# Patient Record
Sex: Female | Born: 2000 | Race: White | Hispanic: No | Marital: Single | Smoking: Never smoker
Health system: Southern US, Community
[De-identification: ages and names within clinical notes are randomized; demographics above are authoritative.]

## PROBLEM LIST (undated history)

## (undated) DIAGNOSIS — G90A Postural orthostatic tachycardia syndrome (POTS): Secondary | ICD-10-CM

## (undated) DIAGNOSIS — I951 Orthostatic hypotension: Secondary | ICD-10-CM

## (undated) DIAGNOSIS — I498 Other specified cardiac arrhythmias: Secondary | ICD-10-CM

## (undated) DIAGNOSIS — R000 Tachycardia, unspecified: Secondary | ICD-10-CM

## (undated) DIAGNOSIS — R51 Headache: Secondary | ICD-10-CM

## (undated) HISTORY — PX: BREAST REDUCTION SURGERY: SHX8

## (undated) HISTORY — DX: Headache: R51

---
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
## 2020-11-12 IMAGING — MR MR MRV HEAD WO/W CM
4 series · 48 of 48 positions shown · IV contrast (13ml multihance)
Comparison: MRI/MRA head 11/23/2014, head CT 03/09/2014

CONTRAST:  13mL MULTIHANCE GADOBENATE DIMEGLUMINE 529 MG/ML IV SOLN

CLINICAL DATA: Chronic migraine without [place] without status
migrainosus, not intractable. Additional history provided by
scanning technologist: Chronic migraine headaches since November 2018.

EXAM:
MRI HEAD WITHOUT AND WITH CONTRAST
MRV HEAD WITHOUT AND WITH CONTRAST
TECHNIQUE: Multiplanar, multiecho pulse sequences of the brain and surrounding
structures were obtained without and with intravenous contrast.
Angiographic images of the intracranial venous structures were
obtained using MRV technique without and with intravenous contrast.

[Series 1: TOF · coronal · 2.5mm · 0.98mm/px · 15 of 112 slices shown]
[im 1/112]
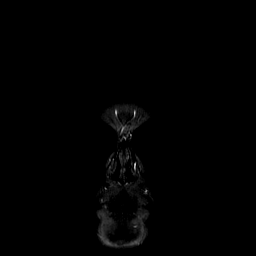
[im 8/112]
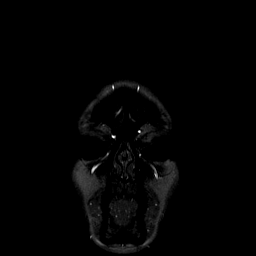
[im 16/112]
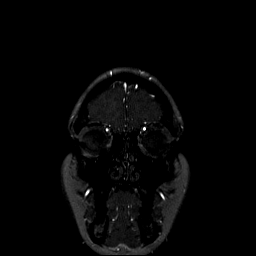
[im 24/112]
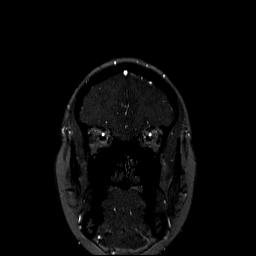
[im 32/112]
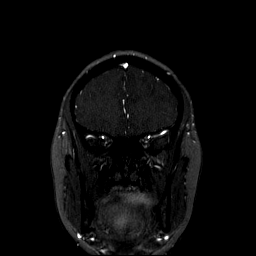
[im 40/112]
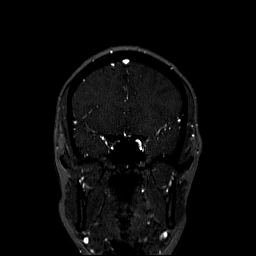
[im 48/112]
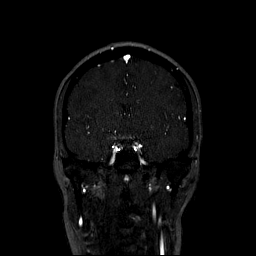
[im 56/112]
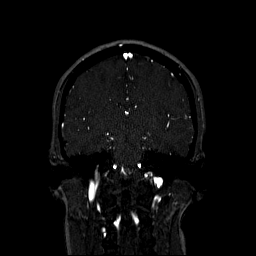
[im 64/112]
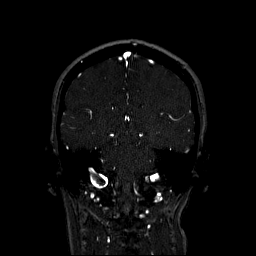
[im 72/112]
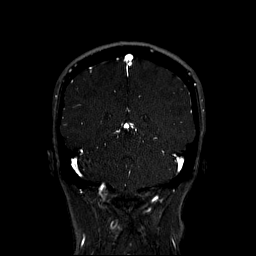
[im 80/112]
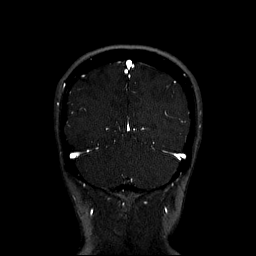
[im 88/112]
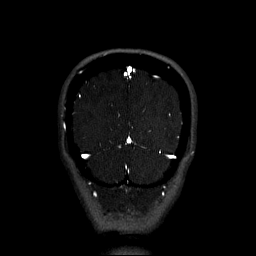
[im 96/112]
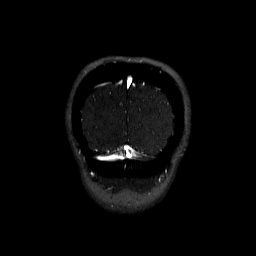
[im 104/112]
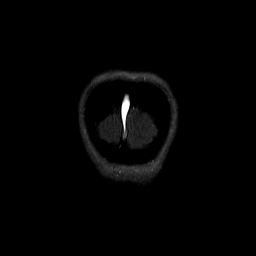
[im 112/112]
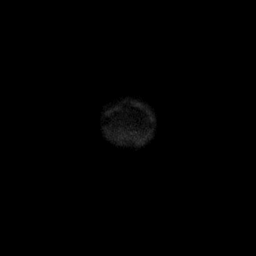

[Series 9: T1 · sagittal · 1.0mm · 0.94mm/px · 18 of 144 slices shown]
[im 1/144]
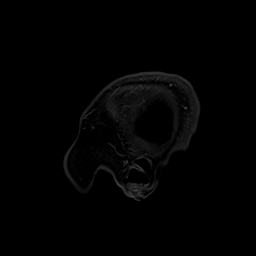
[im 9/144]
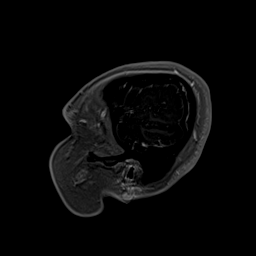
[im 17/144]
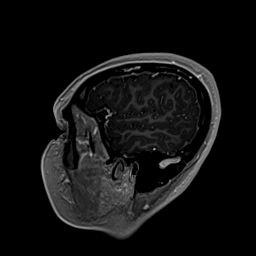
[im 26/144]
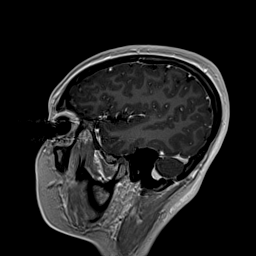
[im 34/144]
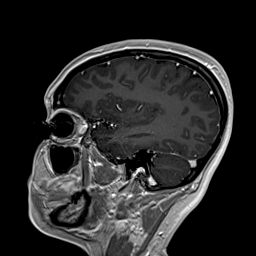
[im 43/144]
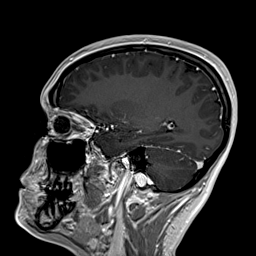
[im 51/144]
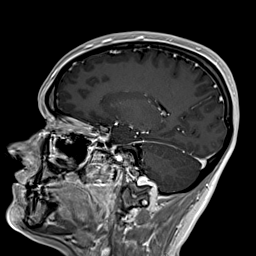
[im 59/144]
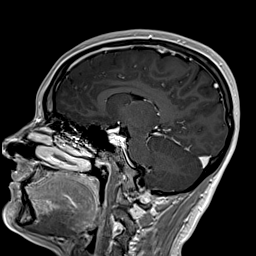
[im 68/144]
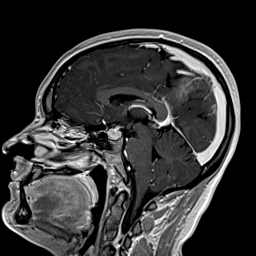
[im 76/144]
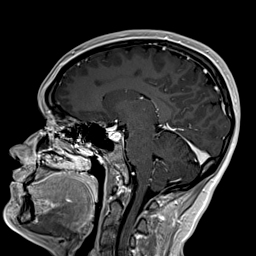
[im 85/144]
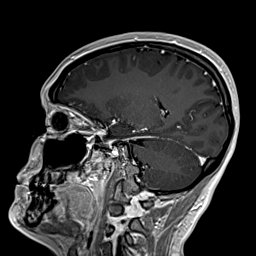
[im 93/144]
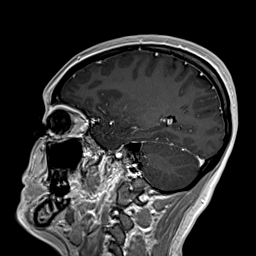
[im 101/144]
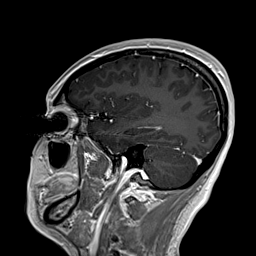
[im 110/144]
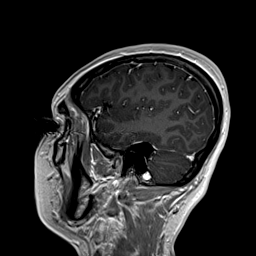
[im 118/144]
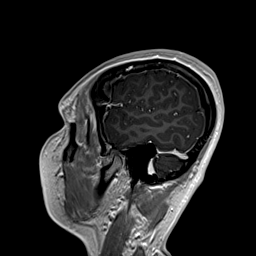
[im 127/144]
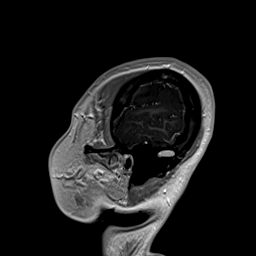
[im 135/144]
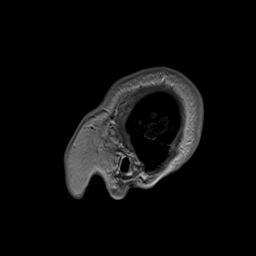
[im 144/144]
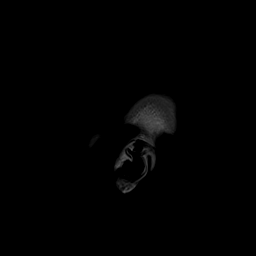

[Series 104: MRV · axial · 1.0mm · 0.94mm/px · z∈[-110,+44]mm · 7 of 53 slices shown (1 of 2)]
[im 1/53]
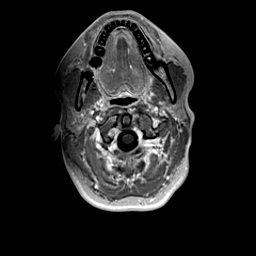
[im 9/53]
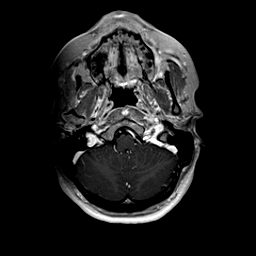
[im 18/53]
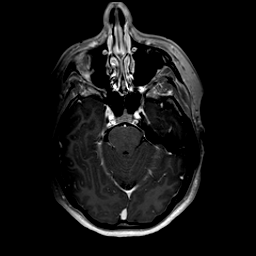
[im 27/53]
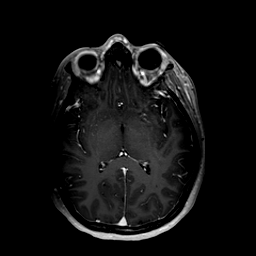
[im 35/53]
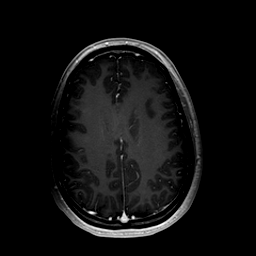
[im 44/53]
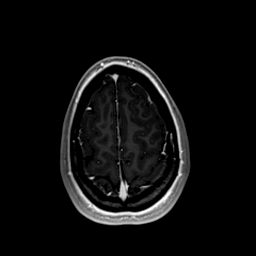
[im 53/53]
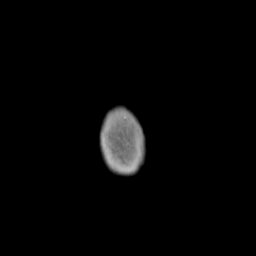

[Series 106: MRV · coronal · 1.0mm · 0.94mm/px · 8 of 66 slices shown (2 of 2)]
[im 1/66]
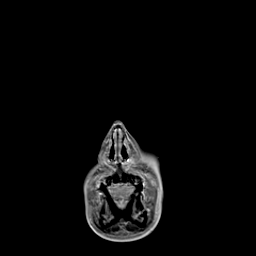
[im 10/66]
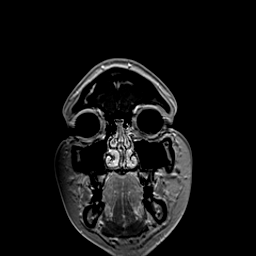
[im 19/66]
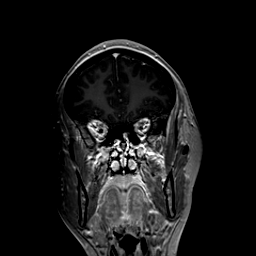
[im 28/66]
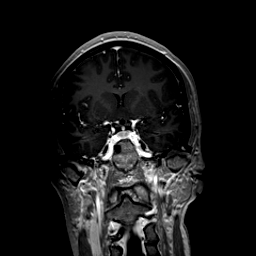
[im 38/66]
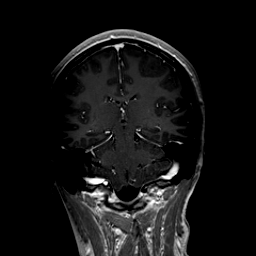
[im 47/66]
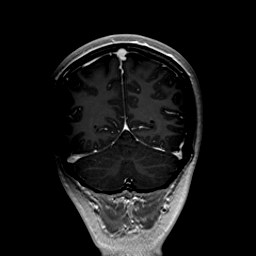
[im 56/66]
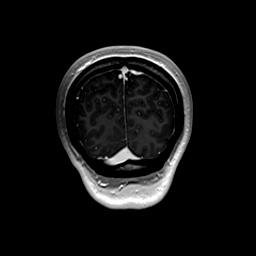
[im 66/66]
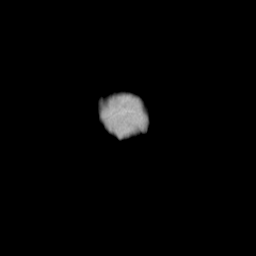

[48 of 48 positions shown; findings below may reference images not displayed]

FINDINGS: MR HEAD FINDINGS

Brain:

There is no evidence of acute infarct.

No evidence of intracranial mass.

No midline shift or extra-axial fluid collection.

No chronic intracranial blood products.

No focal parenchymal signal abnormality or abnormal intracranial
enhancement is demonstrated.

Cerebral volume is normal for age.

The pituitary gland is prominent size, although within normal limits
for patient age.

Vascular: Flow voids maintained within the proximal large arterial
vessels.

Skull and upper cervical spine: Normal marrow signal.

Sinuses/Orbits: Visualized orbits demonstrate no acute abnormality.
Mild ethmoid sinus mucosal thickening. Trace fluid within right
mastoid air cells.

MRV HEAD FINDINGS

The superior sagittal sinus, internal cerebral veins, vein of [place],
straight sinus, transverse sinuses, sigmoid sinuses and visualized
jugular veins are patent. No convincing evidence of thrombus.
IMPRESSION: MRI brain:

1. Normal MRI appearance of the brain. No evidence of acute
intracranial abnormality.
2. Mild ethmoid sinus mucosal thickening.

MRV head:

Unremarkable examination.  No evidence of venous thrombosis.

## 2021-12-13 ENCOUNTER — Encounter (HOSPITAL_BASED_OUTPATIENT_CLINIC_OR_DEPARTMENT_OTHER): Payer: Self-pay | Admitting: Emergency Medicine

## 2021-12-13 ENCOUNTER — Other Ambulatory Visit: Payer: Self-pay

## 2021-12-13 ENCOUNTER — Emergency Department (HOSPITAL_BASED_OUTPATIENT_CLINIC_OR_DEPARTMENT_OTHER)
Admission: EM | Admit: 2021-12-13 | Discharge: 2021-12-13 | Disposition: A | Discharge status: Home / Self Care | Payer: Managed Care, Other (non HMO) | Attending: Emergency Medicine | Admitting: Emergency Medicine

## 2021-12-13 DIAGNOSIS — G43909 Migraine, unspecified, not intractable, without status migrainosus: Secondary | ICD-10-CM | POA: Diagnosis not present

## 2021-12-13 MED ORDER — METOCLOPRAMIDE HCL 5 MG/ML IJ SOLN
10.0000 mg | Freq: Once | INTRAMUSCULAR | Status: AC
  Administered 2021-12-13: 10 mg via INTRAVENOUS
  Filled 2021-12-13: qty 2

## 2021-12-13 MED ORDER — DIPHENHYDRAMINE HCL 50 MG/ML IJ SOLN
25.0000 mg | Freq: Once | INTRAMUSCULAR | Status: AC
  Administered 2021-12-13: 25 mg via INTRAVENOUS
  Filled 2021-12-13: qty 1

## 2021-12-13 MED ORDER — ACETAMINOPHEN 325 MG PO TABS
650.0000 mg | ORAL_TABLET | Freq: Once | ORAL | Status: AC
  Administered 2021-12-13: 650 mg via ORAL
  Filled 2021-12-13: qty 2

## 2021-12-13 MED ORDER — MAGNESIUM SULFATE IN D5W 1-5 GM/100ML-% IV SOLN
1.0000 g | Freq: Once | INTRAVENOUS | Status: AC
  Administered 2021-12-13: 1 g via INTRAVENOUS
  Filled 2021-12-13: qty 100

## 2021-12-13 MED ORDER — ONDANSETRON 4 MG PO TBDP
4.0000 mg | ORAL_TABLET | Freq: Once | ORAL | Status: AC | PRN
  Administered 2021-12-13: 4 mg via ORAL
  Filled 2021-12-13: qty 1

## 2021-12-13 MED ORDER — SODIUM CHLORIDE 0.9 % IV BOLUS
1000.0000 mL | Freq: Once | INTRAVENOUS | Status: AC
  Administered 2021-12-13: 1000 mL via INTRAVENOUS

## 2021-12-13 NOTE — ED Provider Notes (Addendum)
?[facility] EMERGENCY DEPT ?Provider Note ? ? ?CSN: [id number] ?Arrival date & time: 12/13/21  1904 ? ?  ? ?History ? ?Chief Complaint  ?Patient presents with  ? Migraine  ? ? ?Kennley Bracco is a 21 y.o. female. ? ?HPI ?Patient is a 21 year old female with a history of migraines who presents to the emergency department due to a migraine headache.  She states her symptoms started last night and have been constant.  States it is generalized but worse along the frontal aspect of her head.  Reports associated nausea/vomiting, photophobia, phonophobia.  States that she typically gets spots in her vision when experiencing migraines but otherwise denies any visual changes.  Denies any dizziness, difficulty ambulating, fevers, chills, chest pain, shortness of breath, URI symptoms, numbness, weakness.  She states that she has been experiencing chronic migraines since she was a small child and states that her current symptoms feel similar to prior migraines.  She states that she has been taking her prescribed Ubrelvy along with Excedrin Migraine and ibuprofen without relief. ?  ? ?Home Medications ?Prior to Admission medications   ?Medication Sig Start Date End Date Taking? Authorizing Provider  ?acetaminophen (TYLENOL) 500 MG tablet Take 500 mg by mouth every 6 (six) hours as needed for headache.    [provider]  ?aspirin-acetaminophen-caffeine (EXCEDRIN MIGRAINE) [phone] MG per tablet Take 1-2 tablets by mouth every 6 (six) hours as needed for migraine. Take 2 tablets at onset of migraine    [provider]  ?atenolol (TENORMIN) 25 MG tablet Take 1 tablet (25 mg total) by mouth daily. 01/25/15   Keturah Shavers, MD  ?cyclobenzaprine (FLEXERIL) 5 MG tablet Take 1 tablet (5 mg total) by mouth every 8 (eight) hours as needed for muscle spasms. 01/09/17   Sherrilee Gilles, NP  ?eletriptan (RELPAX) 20 MG tablet Take 1 tablet (20 mg total) by mouth once as needed for migraine or headache.  Maximum 2 times a week 01/25/15   Keturah Shavers, MD  ?ibuprofen (ADVIL,MOTRIN) 600 MG tablet Take 1 tablet (600 mg total) by mouth every 6 (six) hours as needed for mild pain or moderate pain. ?Patient taking differently: Take 800 mg by mouth every 6 (six) hours as needed for headache, mild pain or moderate pain.  11/22/14   Lowanda Foster, NP  ?ibuprofen (ADVIL,MOTRIN) 600 MG tablet Take 1 tablet (600 mg total) by mouth every 6 (six) hours as needed for headache, mild pain or moderate pain. 01/09/17   Sherrilee Gilles, NP  ?Magnesium Oxide 500 MG TABS Take 500 mg by mouth at bedtime.     [provider]  ?ondansetron (ZOFRAN ODT) 4 MG disintegrating tablet Take 1 tablet (4 mg total) by mouth every 8 (eight) hours as needed for nausea or vomiting. 01/09/17   Sherrilee Gilles, NP  ?ondansetron (ZOFRAN-ODT) 4 MG disintegrating tablet Take 1 tablet (4 mg total) by mouth every 6 (six) hours as needed for nausea or vomiting. 11/22/14   Lowanda Foster, NP  ?promethazine (PHENERGAN) 12.5 MG tablet Give 1 tablet at onset of nausea. May repeat every 8 hours if nausea persists. 05/08/13   Elveria Rising, NP  ?Riboflavin 100 MG TABS Take 100 mg by mouth daily.     [provider]  ?topiramate (TOPAMAX) 25 MG tablet Take 1 tablet (25 mg total) by mouth at bedtime. 01/25/15   Keturah Shavers, MD  ?   ? ?Allergies    ?Penicillins, Prednisone, Inderal [propranolol], and Other   ? ?Review  of Systems   ?Review of Systems  ?All other systems reviewed and are negative. ?Ten systems reviewed and are negative for acute change, except as noted in the HPI.   ?Physical Exam ?Updated Vital Signs ?BP 113/67   Pulse 72   Temp 98.4 ?F (36.9 ?C) (Oral)   Resp 18   Wt 81.6 kg   LMP 11/16/2021 (Approximate)   SpO2 100%  ?Physical Exam ?Vitals and nursing note reviewed.  ?Constitutional:   ?   General: She is not in acute distress. ?   Appearance: Normal appearance. She is not ill-appearing, toxic-appearing or diaphoretic.   ?HENT:  ?   Head: Normocephalic and atraumatic.  ?   Right Ear: External ear normal.  ?   Left Ear: External ear normal.  ?   Nose: Nose normal.  ?   Mouth/Throat:  ?   Mouth: Mucous membranes are moist.  ?   Pharynx: Oropharynx is clear. No oropharyngeal exudate or posterior oropharyngeal erythema.  ?Eyes:  ?   General: No scleral icterus.    ?   Right eye: No discharge.     ?   Left eye: No discharge.  ?   Extraocular Movements: Extraocular movements intact.  ?   Conjunctiva/sclera: Conjunctivae normal.  ?   Pupils: Pupils are equal, round, and reactive to light.  ?   Comments: PERRL. EOMI.  ?Cardiovascular:  ?   Rate and Rhythm: Normal rate and regular rhythm.  ?   Pulses: Normal pulses.  ?   Heart sounds: Normal heart sounds. No murmur heard. ?  No friction rub. No gallop.  ?Pulmonary:  ?   Effort: Pulmonary effort is normal. No respiratory distress.  ?   Breath sounds: Normal breath sounds. No stridor. No wheezing, rhonchi or rales.  ?Abdominal:  ?   General: Abdomen is flat. There is no distension.  ?Musculoskeletal:     ?   General: Normal range of motion.  ?   Cervical back: Normal range of motion and neck supple. No tenderness.  ?Skin: ?   General: Skin is warm and dry.  ?Neurological:  ?   General: No focal deficit present.  ?   Mental Status: She is alert and oriented to person, place, and time.  ?   Comments: Patient is oriented to person, place, and time. Patient phonates in clear, complete, and coherent sentences. Strength is 5/5 in all four extremities. Distal sensation intact in all four extremities.  ?Psychiatric:     ?   Mood and Affect: Mood normal.     ?   Behavior: Behavior normal.  ? ?ED Results / Procedures / Treatments   ?Labs ?(all labs ordered are listed, but only abnormal results are displayed) ?Labs Reviewed - No data to display ? ?EKG ?None ? ?Radiology ?No results found. ? ?Procedures ?Procedures  ? ?Medications Ordered in ED ?Medications  ?ondansetron (ZOFRAN-ODT) disintegrating  tablet 4 mg (4 mg Oral Given 12/13/21 1925)  ?metoCLOPramide (REGLAN) injection 10 mg (10 mg Intravenous Given 12/13/21 2130)  ?magnesium sulfate IVPB 1 g 100 mL (0 g Intravenous Stopped 12/13/21 2243)  ?diphenhydrAMINE (BENADRYL) injection 25 mg (25 mg Intravenous Given 12/13/21 2130)  ?sodium chloride 0.9 % bolus 1,000 mL (0 mLs Intravenous Stopped 12/13/21 2243)  ?acetaminophen (TYLENOL) tablet 650 mg (650 mg Oral Given 12/13/21 2130)  ? ?ED Course/ Medical Decision Making/ A&P ?  ?                        ?  Medical Decision Making ?Risk ?OTC drugs. ?Prescription drug management. ? ?Pt is a 21 y.o. female with a history of migraines who presents to the emergency department due to migraine that began last night.  States that her headache is consistent with prior migraines. ? ?On my exam heart is regular rate and rhythm without murmurs, rubs, or gallops.  Lungs are clear to auscultation bilaterally.  Neurological exam appears reassuring.  Strength is 5/5 in all 4 extremities.  Extraocular movements are intact.  Pupils are equal, round, and reactive to light.  No gross deficits. ? ?Patient's symptoms were treated with IV magnesium, IV fluids, oral Tylenol, IV Benadryl, IV Reglan.  She notes significant improvement in her symptoms.  States her pain is currently about a 3/10.  Offered her a dose of Decadron to help prevent rebound headache but she declined. ? ?Patient appears stable for discharge at this time and she is agreeable.  Recommended neurology follow-up.  We discussed return precautions.  Her questions were answered and she was amicable at the time of discharge. ? ?Note: Portions of this report may have been transcribed using voice recognition software. Every effort was made to ensure accuracy; however, inadvertent computerized transcription errors may be present.  ? ?Final Clinical Impression(s) / ED Diagnoses ?Final diagnoses:  ?Migraine without status migrainosus, not intractable, unspecified migraine type  ? ?Rx  / DC Orders ?ED Discharge Orders   ? ? None  ? ?  ? ? ?  ?Placido Sou, PA-C ?12/13/21 2252 ? ?  ?Placido Sou, PA-C ?12/13/21 2302 ? ?  ?Derwood Kaplan, MD ?12/19/21 [phone] ? ?

## 2021-12-13 NOTE — ED Notes (Signed)
Pt verbalizes understanding of discharge instructions. Opportunity for questioning and answers were provided. Pt discharged from ED to home with mother.   ? ?

## 2021-12-13 NOTE — ED Triage Notes (Signed)
Pt with a known migraine disorder presents for HA since last night, bilateral generalized, 7/10. Accompanied by N/V, photophobia. Denies vision change, dizziness, trouble walking, fever, chills. Has tried abortive medications without relief.  ?

## 2021-12-13 NOTE — Discharge Instructions (Signed)
Please follow-up with your neurologist regarding your symptoms as well as this visit today.  If you develop any new or worsening symptoms please come back to the emergency department immediately.   ?

## 2023-08-14 ENCOUNTER — Ambulatory Visit: Payer: PRIVATE HEALTH INSURANCE | Admitting: Allergy

## 2023-08-14 ENCOUNTER — Encounter: Payer: Self-pay | Admitting: Allergy

## 2023-08-14 VITALS — BP 104/68 | HR 78 | Resp 18 | Ht 69.0 in | Wt 184.2 lb

## 2023-08-14 DIAGNOSIS — R1110 Vomiting, unspecified: Secondary | ICD-10-CM

## 2023-08-14 DIAGNOSIS — L509 Urticaria, unspecified: Secondary | ICD-10-CM

## 2023-08-14 DIAGNOSIS — G43809 Other migraine, not intractable, without status migrainosus: Secondary | ICD-10-CM | POA: Diagnosis not present

## 2023-08-14 DIAGNOSIS — K529 Noninfective gastroenteritis and colitis, unspecified: Secondary | ICD-10-CM | POA: Diagnosis not present

## 2023-08-14 MED ORDER — EPINEPHRINE 0.3 MG/0.3ML IJ SOAJ: INTRAMUSCULAR | 3 refills | Status: AC

## 2023-08-14 NOTE — Patient Instructions (Addendum)
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  -Previous alpha gal IgE was negative however this did not look into the red meat specifically -Order alpha-gal panel that includes beef, pork, and lamb IgE  -Order tryptase level to assess for hyperactive allergy cells. -Prescribe EpiPen due to severity of reactions.  Follow emergency action plan in case of reaction. -Advise patient to continue avoiding red meat and pork until further notice.  Migraines Long-standing history of migraines since fourth grade. No clear food triggers identified, but patient has a known sensitivity to gluten. -Will obtain wheat IgE to look for specific allergy   Follow-up in 4-6 months or sooner if needed

## 2023-08-14 NOTE — Progress Notes (Signed)
New Patient Note  RE: Colleen Leach MRN: [id number] DOB: June 05, 2001 Date of Office Visit: 08/14/2023  Primary care provider: Hadley Pen, MD  Chief Complaint: reactions  History of present illness: Colleen Leach is a 22 y.o. female presenting today for evaluation of allergic reactions.  She presents today with her mother.   Discussed the use of AI scribe software for clinical note transcription with the patient, who gave verbal consent to proceed.  The patient, with a history of migraines and eczema, presents with a concern for alpha-gal or meat allergy. The symptoms began in September with hives, initially mistaken for insect bites. The patient then experienced severe gastrointestinal symptoms, including vomiting and diarrhea, typically starting in the middle of the night and lasting until the following evening. These episodes were initially attributed to food poisoning or stress from opening a new boutique. However, the patient noticed a correlation between these episodes and the consumption of red meat or pork. The symptoms have been progressing, with the onset moving closer to the time of meat consumption. The most recent episode occurred after eating at a restaurant ([employer]) where likely cross-contamination of chicken with red meat was suspected.  The patient has since eliminated red meat from her diet and has been symptom-free, except for the aforementioned episode at [employer]. She has been able to tolerate chicken, [place], and dairy without any issues. The patient also has a sensitivity to gluten, identified through an food sensitivity panel done at [facility] several years ago, and has been managing migraines since the fourth grade.   Denies history of asthma.  Eczema as a child that has been outgrown.  Has mild allergy symptoms that she does not need to take any medications for.     Review of systems: 10pt ROS negative unless noted above in HPI  All other  systems negative unless noted above in HPI  Past medical history: Past Medical History:  Diagnosis Date   Headache(784.0)    POTS (postural orthostatic tachycardia syndrome)     Past surgical history: Past Surgical History:  Procedure Laterality Date   BREAST REDUCTION SURGERY Bilateral    TONSILLECTOMY AND ADENOIDECTOMY  09/04/2004   UMBILICAL HERNIA REPAIR  12/17/2003    Family history:  Family History  Problem Relation Age of Onset   Allergic rhinitis Mother    Asthma Mother    Migraines Mother    Luiz Blare' disease Mother    Migraines Father    Hashimoto's thyroiditis Brother    Graves' disease Brother    Stroke Paternal Grandfather     Social history: Lives in a home with carpeting in bedroom with electric heating and central cooling.  Bunny and dog in the home. No concern for water damage, mildew or roaches in the home.  She is self-employed.  Denies smoking history.    Medication List: Current Outpatient Medications  Medication Sig Dispense Refill   citalopram (CELEXA) 20 MG tablet Take 40 mg by mouth daily.     UBRELVY 100 MG TABS Take by mouth.     No current facility-administered medications for this visit.    Known medication allergies: Allergies  Allergen Reactions   Penicillins Hives   Prednisone Other (See Comments)    Severe Muscle Pain, Skin Sensitivity, Bruising   Gabapentin Other (See Comments)    Anxiety    Inderal [Propranolol] Other (See Comments)    Painful raised blisters   Other     Gluten, Lactose Intolerant  Physical examination: Blood pressure 104/68, pulse 78, resp. rate 18, height 5\' 9"  (1.753 m), weight 184 lb 3.2 oz (83.6 kg), SpO2 98%.  General: Alert, interactive, in no acute distress. HEENT: PERRLA, TMs pearly gray, turbinates moderately edematous without discharge, post-pharynx non erythematous. Neck: Supple without lymphadenopathy. Lungs: Clear to auscultation without wheezing, rhonchi or rales. {no increased work of  breathing. CV: Normal S1, S2 without murmurs. Abdomen: Nondistended, nontender. Skin: Warm and dry, without lesions or rashes. Extremities:  No clubbing, cyanosis or edema. Neuro:   Grossly intact.  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  -Previous alpha gal IgE was negative however this did not look into the red meat specifically -Order alpha-gal panel that includes beef, pork, and lamb IgE  -Order tryptase level to assess for hyperactive allergy cells. -Prescribe EpiPen due to severity of reactions.  Follow emergency action plan in case of reaction. -Advise patient to continue avoiding red meat and pork until further notice.  Migraines Long-standing history of migraines since fourth grade. No clear food triggers identified, but patient has a known sensitivity to gluten. -Will obtain wheat IgE to look for specific allergy  Follow-up in 4-6 months or sooner if needed  I appreciate the opportunity to take part in Colleen Leach's care. Please do not hesitate to contact me with questions.  Sincerely,   Margo Aye, MD Allergy/Immunology [facility]

## 2023-08-18 LAB — ALPHA-GAL PANEL: IgE (Immunoglobulin E), Serum: 195 [IU]/mL (ref 6–495)

## 2023-08-18 LAB — ALLERGEN, WHEAT, F4

## 2023-08-18 LAB — TRYPTASE: Tryptase: 4.1 ug/L (ref 2.2–13.2)

## 2024-06-11 ENCOUNTER — Encounter (HOSPITAL_BASED_OUTPATIENT_CLINIC_OR_DEPARTMENT_OTHER): Payer: Self-pay | Admitting: Emergency Medicine

## 2024-06-11 ENCOUNTER — Ambulatory Visit (HOSPITAL_BASED_OUTPATIENT_CLINIC_OR_DEPARTMENT_OTHER)
Admission: EM | Admit: 2024-06-11 | Discharge: 2024-06-11 | Disposition: A | Discharge status: Home / Self Care | Payer: PRIVATE HEALTH INSURANCE | Attending: Family Medicine | Admitting: Family Medicine

## 2024-06-11 DIAGNOSIS — R051 Acute cough: Secondary | ICD-10-CM

## 2024-06-11 DIAGNOSIS — J029 Acute pharyngitis, unspecified: Secondary | ICD-10-CM | POA: Diagnosis not present

## 2024-06-11 DIAGNOSIS — J069 Acute upper respiratory infection, unspecified: Secondary | ICD-10-CM

## 2024-06-11 LAB — POC SOFIA SARS ANTIGEN FIA: SARS Coronavirus 2 Ag: NEGATIVE

## 2024-06-11 LAB — POCT RAPID STREP A (OFFICE): Rapid Strep A Screen: NEGATIVE

## 2024-06-11 MED ORDER — FLUTICASONE PROPIONATE 50 MCG/ACT NA SUSP: 1.0000 | Freq: Two times a day (BID) | NASAL | 0 refills | Status: AC | PRN

## 2024-06-11 MED ORDER — PROMETHAZINE-DM 6.25-15 MG/5ML PO SYRP: 5.0000 mL | ORAL_SOLUTION | Freq: Four times a day (QID) | ORAL | 0 refills | Status: AC | PRN

## 2024-06-11 NOTE — ED Triage Notes (Signed)
 Pt c/o sore throat, nasal drainage, some coughing, fatigue started yesterday. Pt has taken Dayquil no improvement

## 2024-06-11 NOTE — Discharge Instructions (Addendum)
 Viral upper respiratory infection with sore throat and cough: Rapid strep and rapid COVID are negative.  Due to the duration of her symptoms, she may still have COVID.  Encouraged to test for COVID after lunch tomorrow.  Get plenty of fluids and rest.  Fluticasone nasal spray, 1 spray into each nostril twice daily if needed for nasal congestion.  Fluticasone is different than prednisone but if any signs or symptoms of poor tolerance of the fluticasone, stop the fluticasone.  Promethazine  DM, 5 mL, every 6 hours if needed for cough.  Work excuse provided.  Follow-up if symptoms do not improve, worsen or new symptoms occur.

## 2024-06-11 NOTE — ED Provider Notes (Signed)
 [facility]    CSN: [id number] Arrival date & time: 06/11/24  1823      History   Chief Complaint Chief Complaint  Patient presents with   Sore Throat   Cough   Nasal Congestion    HPI Colleen Leach is a 23 y.o. female.   Patient here with her mother.  She is reporting sore throat, cough, facial pressure, body aches.  Symptoms started in the afternoon of 06/10/2024.  She denies fever, chills, nausea, vomiting, constipation, diarrhea.   Sore Throat Pertinent negatives include no chest pain, no abdominal pain and no shortness of breath.  Cough Associated symptoms: rhinorrhea and sore throat   Associated symptoms: no chest pain, no chills, no ear pain, no fever, no rash and no shortness of breath     Past Medical History:  Diagnosis Date   Headache(784.0)    POTS (postural orthostatic tachycardia syndrome)     Patient Active Problem List   Diagnosis Date Noted   Status migrainosus 11/25/2014   POTS (postural orthostatic tachycardia syndrome) 03/18/2014   Tension headache 06/16/2013   Migraine without aura and without status migrainosus, not intractable 12/27/2012   Lactose intolerance 12/27/2012   Migraine without aura 11/21/2012   Migraine variant 11/21/2012   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      Home Medications    Prior to Admission medications   Medication Sig Start Date End Date Taking? Authorizing Provider  citalopram (CELEXA) 20 MG tablet Take 40 mg by mouth daily. 07/28/22  Yes [provider]  fluticasone (FLONASE) 50 MCG/ACT nasal spray Place 1 spray into both nostrils 2 (two) times daily as needed for rhinitis. 06/11/24 07/11/24 Yes Ival Domino, FNP   promethazine -dextromethorphan (PROMETHAZINE -DM) 6.25-15 MG/5ML syrup Take 5 mLs by mouth 4 (four) times daily as needed for cough. Do not use and drive - May make drowsy. 06/11/24  Yes Ival Domino, FNP  EPINEPHrine  0.3 mg/0.3 mL IJ SOAJ injection Use as directed for life threatening allergic reactions 08/14/23   Jeneal Danita Macintosh, MD  UBRELVY 100 MG TABS Take by mouth. 02/23/21   [provider]    Family History Family History  Problem Relation Age of Onset   Allergic rhinitis Mother    Asthma Mother    Migraines Mother    Yvone' disease Mother    Migraines Father    Hashimoto's thyroiditis Brother    Graves' disease Brother    Stroke Paternal Grandfather     Social History Social History   Tobacco Use   Smoking status: Never   Smokeless tobacco: Never  Substance Use Topics   Alcohol use: No   Drug use: No     Allergies   Penicillins, Prednisone, Gabapentin, Inderal  [propranolol ], and Other   Review of Systems Review of Systems  Constitutional:  Negative for chills and fever.  HENT:  Positive for congestion, postnasal drip, rhinorrhea, sinus pressure, sinus pain and sore throat. Negative for ear pain.   Eyes:  Negative for pain and visual disturbance.  Respiratory:  Positive for cough. Negative for shortness of breath.   Cardiovascular:  Negative for chest pain and palpitations.  Gastrointestinal:  Negative for abdominal pain, constipation, diarrhea, nausea and vomiting.  Genitourinary:  Negative for  dysuria and hematuria.  Musculoskeletal:  Positive for arthralgias. Negative for back pain.  Skin:  Negative for color change and rash.  Neurological:  Negative for seizures and syncope.  All other systems reviewed and are negative.    Physical Exam Triage Vital Signs ED Triage Vitals  Encounter Vitals Group     BP 06/11/24 1914 121/81     Girls Systolic BP Percentile --      Girls Diastolic BP Percentile --      Boys Systolic BP Percentile --       Boys Diastolic BP Percentile --      Pulse Rate 06/11/24 1914 60     Resp 06/11/24 1914 18     Temp 06/11/24 1914 98.4 F (36.9 C)     Temp Source 06/11/24 1914 Oral     SpO2 06/11/24 1914 98 %     Weight --      Height --      Head Circumference --      Peak Flow --      Pain Score 06/11/24 1913 4     Pain Loc --      Pain Education --      Exclude from Growth Chart --    No data found.  Updated Vital Signs BP 121/81 (BP Location: Right Arm)   Pulse 60   Temp 98.4 F (36.9 C) (Oral)   Resp 18   LMP 06/08/2024   SpO2 98%   Visual Acuity Right Eye Distance:   Left Eye Distance:   Bilateral Distance:    Right Eye Near:   Left Eye Near:    Bilateral Near:     Physical Exam Vitals and nursing note reviewed.  Constitutional:      General: She is not in acute distress.    Appearance: She is well-developed. She is not ill-appearing, toxic-appearing or diaphoretic.  HENT:     Head: Normocephalic and atraumatic.     Right Ear: Hearing, tympanic membrane, ear canal and external ear normal.     Left Ear: Hearing, tympanic membrane, ear canal and external ear normal.     Nose: Congestion and rhinorrhea present. Rhinorrhea is clear.     Right Sinus: Maxillary sinus tenderness present. No frontal sinus tenderness.     Left Sinus: Maxillary sinus tenderness present. No frontal sinus tenderness.     Comments: Mild maxillary sinus pressure but no pain    Mouth/Throat:     Lips: Pink.     Mouth: Mucous membranes are moist.     Pharynx: Uvula midline. Posterior oropharyngeal erythema present. No oropharyngeal exudate.     Tonsils: No tonsillar exudate.  Eyes:     Conjunctiva/sclera: Conjunctivae normal.     Pupils: Pupils are equal, round, and reactive to light.  Cardiovascular:     Rate and Rhythm: Normal rate and regular rhythm.     Heart sounds: S1 normal and S2 normal. No murmur heard. Pulmonary:     Effort: Pulmonary effort is normal. No respiratory distress.      Breath sounds: Normal breath sounds. No decreased breath sounds, wheezing, rhonchi or rales.  Abdominal:     General: Bowel sounds are normal.     Palpations: Abdomen is soft.     Tenderness: There is no abdominal tenderness.  Musculoskeletal:        General: No swelling.     Cervical back: Neck supple.  Lymphadenopathy:     Head:     Right side of  head: No submental, submandibular, tonsillar, preauricular or posterior auricular adenopathy.     Left side of head: No submental, submandibular, tonsillar, preauricular or posterior auricular adenopathy.     Cervical: Cervical adenopathy present.     Right cervical: Superficial cervical adenopathy present.     Left cervical: Superficial cervical adenopathy present.  Skin:    General: Skin is warm.     Capillary Refill: Capillary refill takes less than 2 seconds.     Findings: No rash.  Neurological:     Mental Status: She is alert and oriented to person, place, and time.  Psychiatric:        Mood and Affect: Mood normal.      UC Treatments / Results  Labs (all labs ordered are listed, but only abnormal results are displayed) Labs Reviewed  POC SOFIA SARS ANTIGEN FIA - Normal  POCT RAPID STREP A (OFFICE) - Normal    EKG   Radiology No results found.  Procedures Procedures (including critical care time)  Medications Ordered in UC Medications - No data to display  Initial Impression / Assessment and Plan / UC Course  I have reviewed the triage vital signs and the nursing notes.  Pertinent labs & imaging results that were available during my care of the patient were reviewed by me and considered in my medical decision making (see chart for details).  Plan of Care: Viral upper respiratory infection with sore throat and cough: Rapid strep and rapid COVID are negative.  Due to the duration of her symptoms, she may still have COVID.  Encouraged to test for COVID after lunch tomorrow.  Get plenty of fluids and rest.   Fluticasone nasal spray, 1 spray into each nostril twice daily if needed for nasal congestion.  Fluticasone is different than prednisone but if any signs or symptoms of poor tolerance of the fluticasone, stop the fluticasone.  Promethazine  DM, 5 mL, every 6 hours if needed for cough.  Work excuse provided.  Follow-up if symptoms do not improve, worsen or new symptoms occur.  I reviewed the plan of care with the patient and/or the patient's guardian.  The patient and/or guardian had time to ask questions and acknowledged that the questions were answered.  I provided instruction on symptoms or reasons to return here or to go to an ER, if symptoms/condition did not improve, worsened or if new symptoms occurred.  Final Clinical Impressions(s) / UC Diagnoses   Final diagnoses:  Sore throat  Acute cough  Viral URI with cough     Discharge Instructions      Viral upper respiratory infection with sore throat and cough: Rapid strep and rapid COVID are negative.  Due to the duration of her symptoms, she may still have COVID.  Encouraged to test for COVID after lunch tomorrow.  Get plenty of fluids and rest.  Fluticasone nasal spray, 1 spray into each nostril twice daily if needed for nasal congestion.  Fluticasone is different than prednisone but if any signs or symptoms of poor tolerance of the fluticasone, stop the fluticasone.  Promethazine  DM, 5 mL, every 6 hours if needed for cough.  Work excuse provided.  Follow-up if symptoms do not improve, worsen or new symptoms occur.    ED Prescriptions     Medication Sig Dispense Auth. Provider   fluticasone (FLONASE) 50 MCG/ACT nasal spray Place 1 spray into both nostrils 2 (two) times daily as needed for rhinitis. 17 mL Ival Domino, FNP   promethazine -dextromethorphan (PROMETHAZINE -DM) 6.25-15 MG/5ML  syrup Take 5 mLs by mouth 4 (four) times daily as needed for cough. Do not use and drive - May make drowsy. 118 mL Ival Domino, FNP      PDMP not  reviewed this encounter.   Ival Domino, FNP 06/11/24 [phone]

## 2024-06-11 NOTE — Medical Student Note (Signed)
 [facility] Provider Student Note For educational purposes for Medical, PA and NP students only and not part of the legal medical record.   CSN: [id number] Arrival date & time: 06/11/24  1823      History   Chief Complaint Chief Complaint  Patient presents with   Sore Throat   Cough   Nasal Congestion    HPI Colleen Leach is a 23 y.o. female.  Pt has a hx of migraine and POTS. Yesterday she woke up with a sore throat, chills, post nasal drip, sinus pressure, rhinorrhea, and sporadic dry cough. She did have one episode of nausea yesterday. Denies abdominal pain, vomiting, diarrhea, or urinary symptoms. She denies known sick contacts, but was around a crowd at the [employer] homecoming game this past weekend.   The history is provided by the patient.  Sore Throat Associated symptoms include headaches. Pertinent negatives include no chest pain, no abdominal pain and no shortness of breath.  Cough Associated symptoms: chills, headaches, myalgias, rhinorrhea and sore throat   Associated symptoms: no chest pain, no fever, no shortness of breath and no wheezing     Past Medical History:  Diagnosis Date   Headache(784.0)    POTS (postural orthostatic tachycardia syndrome)     Patient Active Problem List   Diagnosis Date Noted   Status migrainosus 11/25/2014   POTS (postural orthostatic tachycardia syndrome) 03/18/2014   Tension headache 06/16/2013   Migraine without aura and without status migrainosus, not intractable 12/27/2012   Lactose intolerance 12/27/2012   Migraine without aura 11/21/2012   Migraine variant 11/21/2012   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      Home Medications     Prior to Admission medications   Medication Sig Start Date End Date Taking? Authorizing Provider  citalopram (CELEXA) 20 MG tablet Take 40 mg by mouth daily. 07/28/22  Yes [provider]  EPINEPHrine  0.3 mg/0.3 mL IJ SOAJ injection Use as directed for life threatening allergic reactions 08/14/23   Jeneal Danita Macintosh, MD  UBRELVY 100 MG TABS Take by mouth. 02/23/21   [provider]    Family History Family History  Problem Relation Age of Onset   Allergic rhinitis Mother    Asthma Mother    Migraines Mother    Yvone' disease Mother    Migraines Father    Hashimoto's thyroiditis Brother    Graves' disease Brother    Stroke Paternal Grandfather     Social History Social History   Tobacco Use   Smoking status: Never   Smokeless tobacco: Never  Substance Use Topics   Alcohol use: No   Drug use: No     Allergies   Penicillins, Prednisone, Gabapentin, Inderal  [propranolol ], and Other   Review of Systems Review of Systems  Constitutional:  Positive for chills and fatigue. Negative for fever.  HENT:  Positive for congestion, postnasal drip, rhinorrhea, sinus pressure and sore throat.   Respiratory:  Positive for cough. Negative for shortness of breath and wheezing.   Cardiovascular:  Negative for chest pain.  Gastrointestinal:  Negative for abdominal pain, diarrhea, nausea and vomiting.  Genitourinary:  Negative for difficulty urinating.  Musculoskeletal:  Positive for myalgias.  Neurological:  Positive for headaches.  Physical Exam Updated Vital Signs BP 121/81 (BP Location: Right Arm)   Pulse 60   Temp 98.4 F (36.9 C) (Oral)   Resp 18   LMP 06/08/2024   SpO2 98%   Physical Exam Constitutional:      Appearance: She is well-developed.  HENT:     Right Ear: Tympanic membrane, ear canal and external ear normal.     Left Ear: Tympanic membrane, ear canal and external ear normal.     Nose: Rhinorrhea present.     Mouth/Throat:      Pharynx: Posterior oropharyngeal erythema and postnasal drip present.  Cardiovascular:     Rate and Rhythm: Normal rate and regular rhythm.     Pulses: Normal pulses.     Heart sounds: Normal heart sounds.  Pulmonary:     Effort: Pulmonary effort is normal.     Breath sounds: Normal breath sounds.  Abdominal:     General: Bowel sounds are normal.     Palpations: Abdomen is soft.     Tenderness: There is no abdominal tenderness.  Musculoskeletal:     Cervical back: Neck supple.  Lymphadenopathy:     Cervical: Cervical adenopathy present.  Neurological:     Mental Status: She is alert.      ED Treatments / Results  Labs (all labs ordered are listed, but only abnormal results are displayed) Labs Reviewed  POC SOFIA SARS ANTIGEN FIA - Normal  POCT RAPID STREP A (OFFICE) - Normal    EKG  Radiology No results found.  Procedures Procedures (including critical care time)  Medications Ordered in ED Medications - No data to display   Initial Impression / Assessment and Plan / ED Course  I have reviewed the triage vital signs and the nursing notes.  Pertinent labs & imaging results that were available during my care of the patient were reviewed by me and considered in my medical decision making (see chart for details).     POC COVID19 and rapid strep test are negative.   Viral URI. Symptoms likely from viral URI. Although COVID19 test is negative, could be a false negative due symptoms only starting 36 hours ago. Will provide symptomatic treatment at this juncture. Promethazine -DM prn for cough. Fluticasone nasal spray daily. Pt with documented prednisone intolerance. Cautioned pt that if she experiences any untoward effects from fluticasone to discontinue immediately. Counseled pt on nasal lavage, warm salt water gargles, and aggressive hydration. Red flag symptoms reviewed and return precautions given.      Final Clinical Impressions(s) / ED Diagnoses   Final  diagnoses:  None    New Prescriptions New Prescriptions   No medications on file
